# Patient Record
Sex: Male | Born: 2000 | Race: Black or African American | Hispanic: No | Marital: Single | Smoking: Never smoker
Health system: Southern US, Community
[De-identification: ages and names within clinical notes are randomized; demographics above are authoritative.]

---
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
## 2020-10-13 ENCOUNTER — Other Ambulatory Visit: Payer: Self-pay

## 2020-10-13 ENCOUNTER — Ambulatory Visit
Admission: RE | Admit: 2020-10-13 | Discharge: 2020-10-13 | Disposition: A | Discharge status: Home / Self Care | Source: Ambulatory Visit | Attending: Family Medicine | Admitting: Family Medicine

## 2020-10-13 ENCOUNTER — Other Ambulatory Visit: Payer: Self-pay | Admitting: Family Medicine

## 2020-10-13 DIAGNOSIS — R079 Chest pain, unspecified: Secondary | ICD-10-CM

## 2020-12-23 ENCOUNTER — Other Ambulatory Visit: Payer: Self-pay

## 2020-12-23 ENCOUNTER — Ambulatory Visit (INDEPENDENT_AMBULATORY_CARE_PROVIDER_SITE_OTHER): Admitting: Podiatry

## 2020-12-23 ENCOUNTER — Other Ambulatory Visit: Payer: Self-pay | Admitting: Podiatry

## 2020-12-23 DIAGNOSIS — L03031 Cellulitis of right toe: Secondary | ICD-10-CM

## 2020-12-23 DIAGNOSIS — M79674 Pain in right toe(s): Secondary | ICD-10-CM | POA: Diagnosis not present

## 2020-12-23 MED ORDER — CEPHALEXIN 500 MG PO CAPS: 500.0000 mg | ORAL_CAPSULE | Freq: Three times a day (TID) | ORAL | 0 refills | Status: DC

## 2020-12-23 NOTE — Patient Instructions (Signed)

## 2020-12-26 LAB — WOUND CULTURE
MICRO NUMBER:: 11481510
SPECIMEN QUALITY:: ADEQUATE

## 2020-12-26 LAB — HOUSE ACCOUNT TRACKING

## 2020-12-28 NOTE — Progress Notes (Signed)
Subjective: Patient as well as his mom for concerns of ingrown toenail to the right great toe, medial aspect.  This is been ongoing for last 2 weeks.  He has had no recent treatment.  The area is tender with pressure.  He has no other concerns today. Denies any systemic complaints such as fevers, chills, nausea, vomiting. No acute changes since last appointment, and no other complaints at this time.   Objective: AAO x3, NAD DP/PT pulses palpable bilaterally, CRT less than 3 seconds Incurvation present to the medial aspect of the right hallux toenail with localized edema and erythema.  There is no drainage or pus prior to the procedure.  See procedure note below.  No ascending cellulitis.  There is tenderness palpation of the medial nail border.  No other areas of discomfort.  No pain with calf compression, swelling, warmth, erythema  Assessment: Right medial hallux paronychia  Plan: -All treatment options discussed with the patient including all alternatives, risks, complications.  -At this time, the patient is requesting partial nail removal with chemical matricectomy to the symptomatic portion of the nail. Risks and complications were discussed with the patient for which they understand and written consent was obtained. Under sterile conditions a total of 3 mL of a mixture of 2% lidocaine plain and 0.5% Marcaine plain was infiltrated in a hallux block fashion. Once anesthetized, the skin was prepped in sterile fashion. A tourniquet was then applied. Next the medial aspect of hallux nail border was then sharply excised making sure to remove the entire offending nail border.  During this there was purulence identified which was cultured.  Once the nails were ensured to be removed area was debrided and the underlying skin was intact.  No further purulence was identified.  To the purulence it did not apply phenol.  The incision was irrigated with alcohol and Silvadene was applied followed by dry sterile  dressing.  The tourniquet was released and there was found to be immediate capillary fill time of the digit.  Tolerated the procedure well with any complications. -Keflex -Patient encouraged to call the office with any questions, concerns, change in symptoms.   Vivi Barrack DPM

## 2021-01-06 ENCOUNTER — Ambulatory Visit: Payer: Self-pay | Admitting: Podiatry

## 2021-01-19 ENCOUNTER — Ambulatory Visit (INDEPENDENT_AMBULATORY_CARE_PROVIDER_SITE_OTHER): Payer: Self-pay | Admitting: Podiatry

## 2021-01-19 ENCOUNTER — Other Ambulatory Visit: Payer: Self-pay

## 2021-01-19 DIAGNOSIS — L03031 Cellulitis of right toe: Secondary | ICD-10-CM

## 2021-01-19 DIAGNOSIS — M79674 Pain in right toe(s): Secondary | ICD-10-CM

## 2021-01-21 NOTE — Progress Notes (Signed)
Subjective: Jay Edwards is a 20 y.o.  male returns to office today for follow up evaluation after having right Hallux partial nail avulsion performed. Patient has been soaking using epsom satls and applying topical antibiotic covered with bandaid daily.  Denies any change or pus or any significant discomfort at this time.  Patient denies fevers, chills, nausea, vomiting. Denies any calf pain, chest pain, SOB.   Objective:  General: Well developed, nourished, in no acute distress, alert and oriented x3   Dermatology: Skin is warm, dry and supple bilateral. Right hallux nail border appears to be clean, dry, with minimal granular tissue and surrounding scab. There is no surrounding erythema, edema, drainage/purulence. The remaining nails appear unremarkable at this time. There are no other lesions or other signs of infection present.  Neurovascular status: Intact. No lower extremity swelling; No pain with calf compression bilateral.  Musculoskeletal: No tenderness to palpation of the right hallux nail fold. Muscular strength within normal limits bilateral.   Assesement and Plan: S/p partial nail avulsion, doing well.   -Continue soaking in epsom salts twice a day followed by antibiotic ointment and a band-aid. Can leave uncovered at night. Continue this until completely healed.  -If the area has not healed in 2 weeks, call the office for follow-up appointment, or sooner if any problems arise.  -Discussed to monitor any reoccurrence of ingrown toenail.  Should this occur will need to likely have a chemical matricectomy. -Monitor for any signs/symptoms of infection. Call the office immediately if any occur or go directly to the emergency room. Call with any questions/concerns.  Ovid Curd, DPM

## 2021-05-11 ENCOUNTER — Ambulatory Visit (INDEPENDENT_AMBULATORY_CARE_PROVIDER_SITE_OTHER): Admitting: Podiatry

## 2021-05-11 ENCOUNTER — Other Ambulatory Visit: Payer: Self-pay

## 2021-05-11 ENCOUNTER — Encounter: Payer: Self-pay | Admitting: Podiatry

## 2021-05-11 DIAGNOSIS — L600 Ingrowing nail: Secondary | ICD-10-CM

## 2021-05-11 DIAGNOSIS — M79674 Pain in right toe(s): Secondary | ICD-10-CM | POA: Diagnosis not present

## 2021-05-11 NOTE — Patient Instructions (Signed)

## 2021-05-12 ENCOUNTER — Other Ambulatory Visit: Payer: Self-pay | Admitting: Podiatry

## 2021-05-12 ENCOUNTER — Telehealth: Payer: Self-pay | Admitting: Podiatry

## 2021-05-12 MED ORDER — IBUPROFEN 800 MG PO TABS: 800.0000 mg | ORAL_TABLET | Freq: Three times a day (TID) | ORAL | 0 refills | Status: AC | PRN

## 2021-05-12 NOTE — Telephone Encounter (Signed)
Pt's mother called stating her son had his ingrown removed and he is in a lot of pain. He has been taking extra strength tylenol and it isn't working. She would like to know if there's something you can prescribe for the pain. Please advise.

## 2021-05-13 NOTE — Progress Notes (Signed)
Subjective: 20 year old male presents the office today for concerns of recurrent ingrown toenail of the right big toe, medial aspect.  Area is tender and there has been localized swelling redness there is no drainage or pus or red streaking.  No recent injury. Denies any systemic complaints such as fevers, chills, nausea, vomiting. No acute changes since last appointment, and no other complaints at this time.   Objective: AAO x3, NAD DP/PT pulses palpable bilaterally, CRT less than 3 seconds There is tenderness palpation on both the medial and lateral aspects of the right hallux toenail although the medial is the symptomatic painful side the other side is also having swelling or redness.  There is no drainage or pus or ascending cellulitis.  No pain with calf compression, swelling, warmth, erythema  Assessment: Ingrown toenail right hallux  Plan: -All treatment options discussed with the patient including all alternatives, risks, complications.  -At this time, the patient is requesting partial nail removal with chemical matricectomy to the symptomatic portion of the nail. Risks and complications were discussed with the patient for which they understand and written consent was obtained. Under sterile conditions a total of 3 mL of a mixture of 2% lidocaine plain and 0.5% Marcaine plain was infiltrated in a hallux block fashion. Once anesthetized, the skin was prepped in sterile fashion. A tourniquet was then applied. Next the GSSC aspect of hallux nail border was then sharply excised making sure to remove the entire offending nail border. Once the nails were ensured to be removed area was debrided and the underlying skin was intact. There is no purulence identified in the procedure. Next phenol was then applied under standard conditions and copiously irrigated. Silvadene was applied. A dry sterile dressing was applied. After application of the dressing the tourniquet was removed and there is found to be  an immediate capillary refill time to the digit. The patient tolerated the procedure well any complications. Post procedure instructions were discussed the patient for which he verbally understood. Follow-up in one week for nail check or sooner if any problems are to arise. Discussed signs/symptoms of infection and directed to call the office immediately should any occur or go directly to the emergency room. In the meantime, encouraged to call the office with any questions, concerns, changes symptoms. -Patient encouraged to call the office with any questions, concerns, change in symptoms.   Vivi Barrack DPM

## 2021-05-26 ENCOUNTER — Ambulatory Visit (INDEPENDENT_AMBULATORY_CARE_PROVIDER_SITE_OTHER): Admitting: Podiatry

## 2021-05-26 ENCOUNTER — Other Ambulatory Visit: Payer: Self-pay

## 2021-05-26 ENCOUNTER — Encounter: Payer: Self-pay | Admitting: Podiatry

## 2021-05-26 DIAGNOSIS — L600 Ingrowing nail: Secondary | ICD-10-CM

## 2021-05-26 NOTE — Patient Instructions (Signed)

## 2021-05-29 DIAGNOSIS — L600 Ingrowing nail: Secondary | ICD-10-CM | POA: Insufficient documentation

## 2021-05-29 NOTE — Progress Notes (Signed)
Subjective: Jay Edwards is a 20 y.o.  male returns to office today for follow up evaluation after having right Hallux medial and lateral partial avulsion performed with chemical matricectomy. Patient has been soaking using Epsom salts and applying topical antibiotic covered with bandaid daily.  Denies any significant pain at this point.  No swelling or redness or any drainage.  Does get ingrown toenail left big toe and is considering having that side performed as well.  It does cause discomfort with pressure.  Patient denies fevers, chills, nausea, vomiting. Denies any calf pain, chest pain, SOB.   Objective:  General: Well developed, nourished, in no acute distress, alert and oriented x3   Dermatology: Skin is warm, dry and supple bilateral.  Right hallux nail border appears to be clean, dry, with slight granular tissue and surrounding scab. There is no surrounding erythema, edema, drainage/purulence.  Incurvation present to left hallux toenail both medial lateral nail borders with tenderness palpation of there is no edema, erythema or signs of infection.  Neurovascular status: Intact. No lower extremity swelling; No pain with calf compression bilateral.  Musculoskeletal: No significant tenderness to palpation of the right hallux nail fold. Muscular strength within normal limits bilateral.   Assesement and Plan: S/p partial nail avulsion, doing well.   -Continue soaking in epsom salts twice a day followed by antibiotic ointment and a band-aid. Can leave uncovered at night. Continue this until completely healed.  -If the area has not healed in 2 weeks, call the office for follow-up appointment, or sooner if any problems arise.  -We will plan to have the left partial nail avulsion performed in the next couple weeks.  We will schedule him for this. -Monitor for any signs/symptoms of infection. Call the office immediately if any occur or go directly to the emergency room. Call with any  questions/concerns.  Ovid Curd, DPM

## 2021-06-02 ENCOUNTER — Ambulatory Visit: Admitting: Podiatry

## 2021-06-12 ENCOUNTER — Ambulatory Visit: Admitting: Podiatry

## 2021-06-22 ENCOUNTER — Other Ambulatory Visit: Payer: Self-pay

## 2021-06-22 ENCOUNTER — Ambulatory Visit (INDEPENDENT_AMBULATORY_CARE_PROVIDER_SITE_OTHER): Admitting: Podiatry

## 2021-06-22 ENCOUNTER — Encounter: Payer: Self-pay | Admitting: Podiatry

## 2021-06-22 ENCOUNTER — Telehealth: Payer: Self-pay | Admitting: Podiatry

## 2021-06-22 DIAGNOSIS — M79674 Pain in right toe(s): Secondary | ICD-10-CM | POA: Diagnosis not present

## 2021-06-22 DIAGNOSIS — L600 Ingrowing nail: Secondary | ICD-10-CM

## 2021-06-22 MED ORDER — MUPIROCIN 2 % EX OINT
1.0000 | TOPICAL_OINTMENT | Freq: Two times a day (BID) | CUTANEOUS | 2 refills | Status: AC
Start: 2021-06-22 — End: ?

## 2021-06-22 MED ORDER — MUPIROCIN 2 % EX OINT: 1.0000 "application " | TOPICAL_OINTMENT | Freq: Two times a day (BID) | CUTANEOUS | 2 refills | Status: DC

## 2021-06-22 NOTE — Patient Instructions (Signed)
Continue soaking in epsom salts twice a day followed by antibiotic ointment and a band-aid. Can leave uncovered at night. Continue this until completely healed.  Monitor for any signs/symptoms of infection. Call the office immediately if any occur or go directly to the emergency room. Call with any questions/concerns.  Soak Instructions    THE DAY AFTER THE PROCEDURE  Place 1/4 cup of epsom salts in a quart of warm tap water.  Submerge your foot or feet with outer bandage intact for the initial soak; this will allow the bandage to become moist and wet for easy lift off.  Once you remove your bandage, continue to soak in the solution for 20 minutes.  This soak should be done twice a day.  Next, remove your foot or feet from solution, blot dry the affected area and cover.  You may use a band aid large enough to cover the area or use gauze and tape.  Apply other medications to the area as directed by the doctor such as polysporin neosporin.  IF YOUR SKIN BECOMES IRRITATED WHILE USING THESE INSTRUCTIONS, IT IS OKAY TO SWITCH TO  WHITE VINEGAR AND WATER. Or you may use antibacterial soap and water to keep the toe clean  Monitor for any signs/symptoms of infection. Call the office immediately if any occur or go directly to the emergency room. Call with any questions/concerns.

## 2021-06-22 NOTE — Telephone Encounter (Signed)
Pts mom called and they have went to [facility] 2 times and they are still telling pt the have not gotten it electronically. The mom is asking if you could please just call it in to the [facility] in [place] on [address].

## 2021-06-22 NOTE — Telephone Encounter (Signed)
Called and spoke with pharmacist and did a verbal order. Misty Stanley

## 2021-06-24 NOTE — Progress Notes (Signed)
Subjective: 20 year old male presents the office with his mom for concerns of discomfort in his right big toe.  Previously had a partial nail avulsion performed in June and he states the toe is getting better but then just stopped improving.  He has occasional discomfort.  Denies any drainage or pus or any swelling or redness.  Points to lateral nail border he has majority symptoms. Denies any systemic complaints such as fevers, chills, nausea, vomiting. No acute changes since last appointment, and no other complaints at this time.   He is going to [place] next week and will be doing a lot of walking.  Objective: AAO x3, NAD DP/PT pulses palpable bilaterally, CRT less than 3 seconds Of the right hallux toenail at the lateral aspect of the base of the nail small amount granulation tissue is present.  Faint clear drainage there is no purulence.  There is no edema, erythema or signs of infection.  No ascending cellulitis.  No other areas of discomfort. No open lesions or pre-ulcerative lesions.  No pain with calf compression, swelling, warmth, erythema  Assessment: History of partial nail avulsion with continued discomfort right side  Plan: -All treatment options discussed with the patient including all alternatives, risks, complications.  -I discussed repeat cardioversion versus trying conservative treatment.  Recommended oral antibiotics with the mom was to try to do topical care prescribed mupirocin ointment and recommend Epson salt soaks twice a day covering with antibiotic ointment.  If no improvement with oral antibiotic if no improvement will need repeat partial avulsion. -Patient encouraged to call the office with any questions, concerns, change in symptoms.   Vivi Barrack DPM

## 2021-07-14 ENCOUNTER — Other Ambulatory Visit: Payer: Self-pay

## 2021-07-14 ENCOUNTER — Ambulatory Visit (INDEPENDENT_AMBULATORY_CARE_PROVIDER_SITE_OTHER): Admitting: Podiatry

## 2021-07-14 ENCOUNTER — Ambulatory Visit: Admitting: Podiatry

## 2021-07-14 DIAGNOSIS — L600 Ingrowing nail: Secondary | ICD-10-CM

## 2021-07-17 ENCOUNTER — Ambulatory Visit: Admitting: Podiatrist

## 2021-07-20 NOTE — Progress Notes (Signed)
Subjective: 20 year old male presents the office with his mom for follow-up evaluation of ingrown toenail right big toe.  I will also and I prescribed mupirocin ointment.  Follow-up with her primary care physician was started on the oral antibiotics.  States he may be looking somewhat better but still causing discomfort.  No drainage or pus.  Objective: AAO x3, NAD-presents with mom DP/PT pulses palpable bilaterally, CRT less than 3 seconds Of the right hallux toenail at the lateral aspect of the base of the nail small amount granulation tissue is still present.  There is no drainage or pus identified.  Minimal edema.  No erythema.  No drainage or pus.  No ascending cellulitis. The nail itself is mildly dystrophic with some transverse ridging. No open lesions or pre-ulcerative lesions.  No pain with calf compression, swelling, warmth, erythema  Assessment: History of partial nail avulsion with continued discomfort right side  Plan: -All treatment options discussed with the patient including all alternatives, risks, complications.  -Previously did mupirocin ointment with not see much improvement so his primary care physician did start him on oral antibiotics.  Even with oral antibiotics discussed that treating the ingrown toenail at this point antibiotics are not going to be overly beneficial.  Discussed when to remove the ingrown portion of the nail.  They did bring up total nail removal today.  I discussed doing this versus repeat partial nail avulsion but I think that as we do more partial nail avulsion the nails get more than a narrow.  Also the nail started to come somewhat mildly dystrophic discussed total nail removal to see if the new nail growing back and would be healthier but discussed this is not a guarantee.  After discussion elects proceed with nail avulsion but likely was on Friday as if we can recover.  I am not in the office on Friday since no follow Dr. Irving Shows for this.   Vivi Barrack DPM

## 2021-08-21 IMAGING — CR DG CHEST 2V
2 series · 2 of 2 positions shown · non-contrast
Comparison: None.

CLINICAL DATA: Left-sided chest pain for 2 weeks

EXAM:
CHEST - 2 VIEW

[w chest pa]
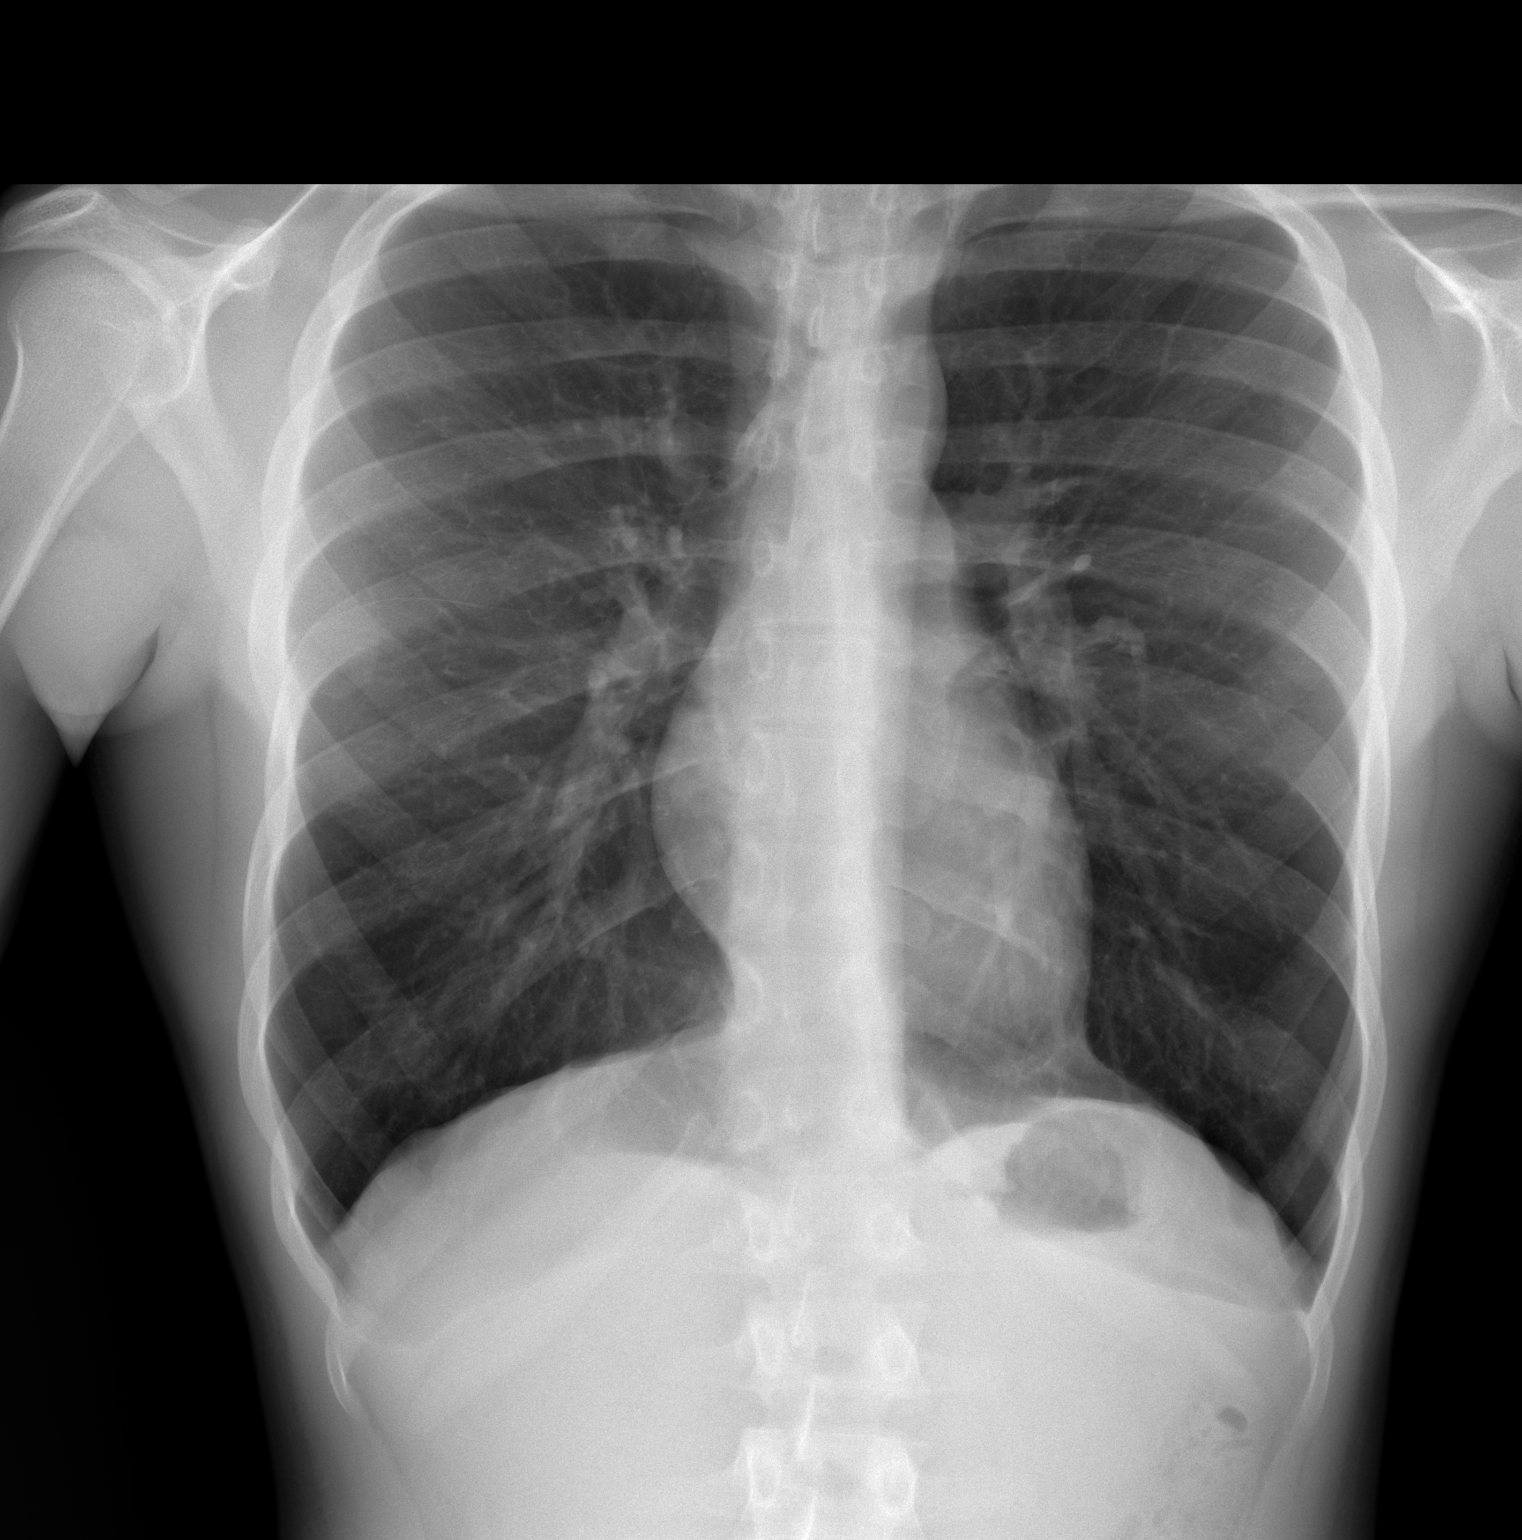

[w chest lat]
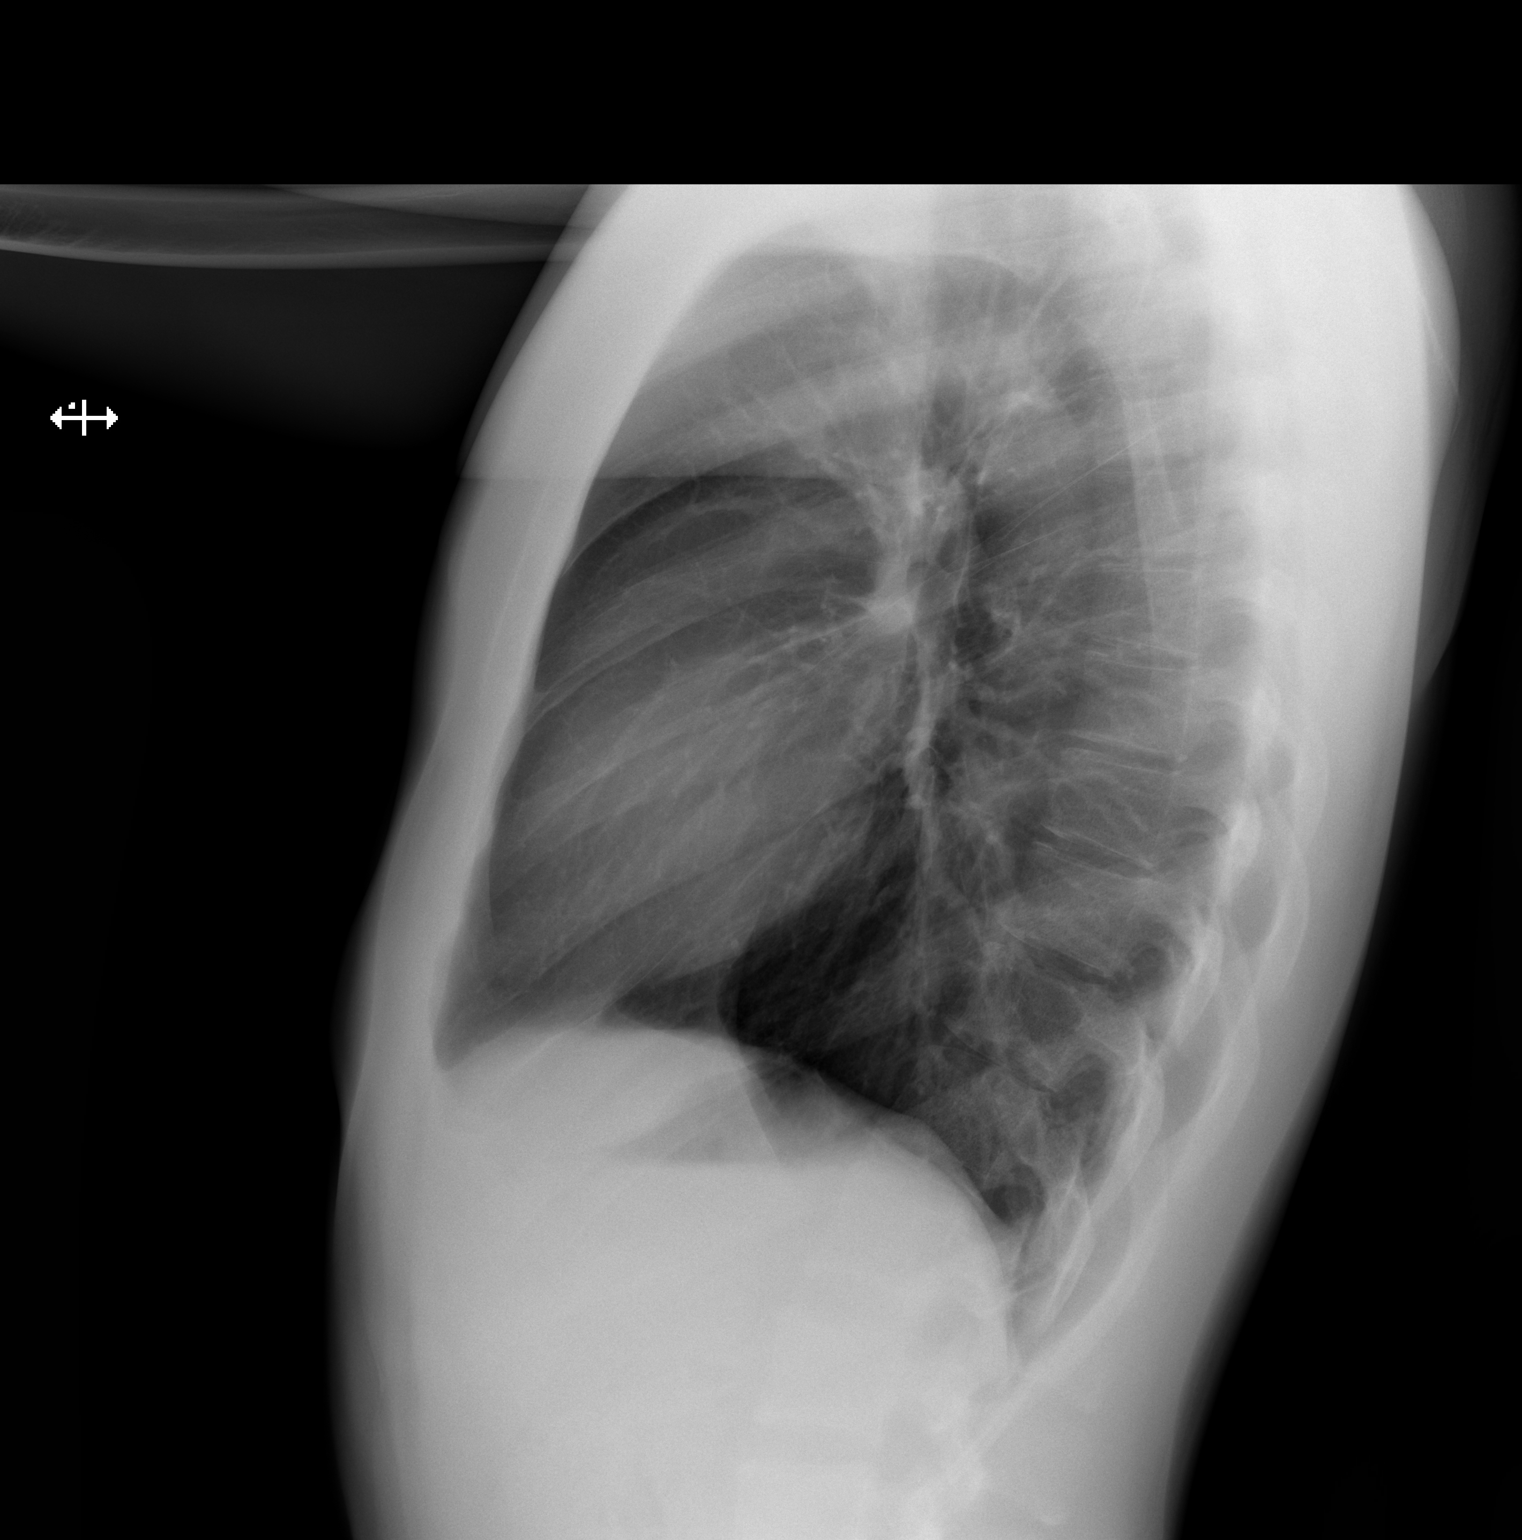

[2 of 2 positions shown; findings below may reference images not displayed]

FINDINGS: The heart size and mediastinal contours are within normal limits.
Both lungs are clear. The visualized skeletal structures are
unremarkable.
IMPRESSION: No active cardiopulmonary disease.

## 2021-09-22 ENCOUNTER — Ambulatory Visit (INDEPENDENT_AMBULATORY_CARE_PROVIDER_SITE_OTHER): Admitting: Podiatry

## 2021-09-22 ENCOUNTER — Other Ambulatory Visit: Payer: Self-pay

## 2021-09-22 DIAGNOSIS — M79674 Pain in right toe(s): Secondary | ICD-10-CM | POA: Diagnosis not present

## 2021-09-22 DIAGNOSIS — L600 Ingrowing nail: Secondary | ICD-10-CM

## 2021-09-22 MED ORDER — MUPIROCIN 2 % EX OINT: 1.0000 "application " | TOPICAL_OINTMENT | Freq: Two times a day (BID) | CUTANEOUS | 2 refills | Status: AC

## 2021-09-22 MED ORDER — CEPHALEXIN 500 MG PO CAPS: 500.0000 mg | ORAL_CAPSULE | Freq: Three times a day (TID) | ORAL | 0 refills | Status: DC

## 2021-09-22 NOTE — Patient Instructions (Signed)

## 2021-09-25 NOTE — Progress Notes (Signed)
Subjective: 20 year old male presents the office today for evaluation of ingrown toenail to his right big toe.  He states he is scheduled to have it removed in a couple weeks but he wants to have the toe checked to make sure it still can be removed.  He has not seen any drainage or pus.  Nail border still tender.  Objective: AAO x3, NAD DP/PT pulses palpable bilaterally, CRT less than 3 seconds Incurvation present along the right hallux, lateral nail border and there is granulation tissue present nail border.  Localized edema and erythema.  No ascending cellulitis.  No purulence noted today.  There is no open lesions.  The nails are mildly dystrophic and discolored.  No pain with calf compression, swelling, warmth, erythema  Assessment: Ingrown toenail right hallux  Plan: -All treatment options discussed with the patient including all alternatives, risks, complications.  -We again today had a long discussion regards to treatment options both partial versus total nail avulsion.  Discussed tenderness today but he wants to hold off and try to wait until before the holidays to be of time to recover without having to walk around campus.  Prescribed cephalexin as well as mupirocin ointment.  Recommended soaking them daily.  If there is any worsening prior to his appointment we will need to proceed with a nail removal before scheduled appointment. -Patient encouraged to call the office with any questions, concerns, change in symptoms.   Vivi Barrack DPM

## 2021-10-09 ENCOUNTER — Other Ambulatory Visit: Payer: Self-pay

## 2021-10-09 ENCOUNTER — Ambulatory Visit (INDEPENDENT_AMBULATORY_CARE_PROVIDER_SITE_OTHER): Admitting: Podiatry

## 2021-10-09 DIAGNOSIS — L600 Ingrowing nail: Secondary | ICD-10-CM

## 2021-10-11 NOTE — Progress Notes (Signed)
Subjective: 20 year old male presents the office today for evaluation of ingrown toenail to his right big toe.  He is scheduled for procedure on Monday and he was at the toenail checked prior to the procedure on Monday.  Reports course of antibiotics.  No purulence.  He has been soaking him and antibiotic ointment on the nail.  No other concerns today.   Objective: AAO x3, NAD DP/PT pulses palpable bilaterally, CRT less than 3 seconds Incurvation present along the right hallux, lateral nail border and there is dried granulation tissue/scab present nail border.  Minimal localized edema and erythema.  No ascending cellulitis.  No purulence noted today.  There is no open lesions.  The nails are mildly dystrophic and discolored.  No pain with calf compression, swelling, warmth, erythema  Assessment: Ingrown toenail right hallux  Plan: -All treatment options discussed with the patient including all alternatives, risks, complications.  -We again had discussions in regards to treatment options.  We discussed partial versus total nail avulsions.  I think this point we can go ahead and proceed with the procedure but he does not want to do it today we will plan on doing this on Monday.  Over the weekend continue soaking Epson salts, antibiotic ointment and a bandage. -Monitor for any clinical signs or symptoms of infection and directed to call the office immediately should any occur or go to the ER.  Vivi Barrack DPM

## 2021-10-12 ENCOUNTER — Ambulatory Visit: Admitting: Podiatry

## 2021-10-12 ENCOUNTER — Ambulatory Visit (INDEPENDENT_AMBULATORY_CARE_PROVIDER_SITE_OTHER): Admitting: Podiatry

## 2021-10-12 ENCOUNTER — Other Ambulatory Visit: Payer: Self-pay

## 2021-10-12 DIAGNOSIS — M79674 Pain in right toe(s): Secondary | ICD-10-CM

## 2021-10-12 DIAGNOSIS — L600 Ingrowing nail: Secondary | ICD-10-CM

## 2021-10-12 NOTE — Patient Instructions (Signed)

## 2021-10-18 NOTE — Progress Notes (Signed)
Subjective: 20 year old male presents the office today with his mom for follow-up evaluation of ingrown toenail of the right big toe.  The patient mom and also his father who was on speaker phone today one half entire toenail removed.  We discussed this on several occasions previously.  He still been soaking in Epson salts, antibiotic ointment and a bandage.  The lateral border of the nail still becomes ingrown causing discomfort.    Objective: AAO x3, NAD DP/PT pulses palpable bilaterally, CRT less than 3 seconds Incurvation presents on the right hallux lateral nail border with mild granulation tissue present.  There is no drainage or pus.  Faint edema likely more from inflammation as opposed to infection.  Tenderness palpation lateral nail border.  No open lesions. No pain with calf compression, swelling, warmth, erythema  Assessment: Ingrown toenail recurrence  Plan: -All treatment options discussed with the patient including all alternatives, risks, complications.  -At this time, the patient and his family are requesting total nail removal without chemical matricectomy to the right hallux given the reoccurrence of the ingrown toenail.  Discussed risks of removing entire toenail.  They understand.  Risks and complications were discussed with the patient for which they understand and  verbally consent to the procedure. Under sterile conditions a total of 3 mL of a mixture of 2% lidocaine plain and 0.5% Marcaine plain was infiltrated in a hallux block fashion. Once anesthetized, the skin was prepped in sterile fashion. A tourniquet was then applied. Next the right hallux nail was was sharply excised making sure to remove the entire offending nail nail. Once the nail was removed, the area was debrided and the underlying skin was intact. The area was irrigated and hemostasis was obtained.  A dry sterile dressing was applied. After application of the dressing the tourniquet was removed and there is found  to be an immediate capillary refill time to the digit. The patient tolerated the procedure well any complications. Post procedure instructions were discussed the patient for which he verbally understood. Follow-up in one week for nail check or sooner if any problems are to arise. Discussed signs/symptoms of worsening infection and directed to call the office immediately should any occur or go directly to the emergency room. In the meantime, encouraged to call the office with any questions, concerns, changes symptoms. -Patient encouraged to call the office with any questions, concerns, change in symptoms.   Vivi Barrack DPM

## 2021-10-26 ENCOUNTER — Ambulatory Visit: Admitting: Podiatry

## 2021-11-03 ENCOUNTER — Ambulatory Visit (INDEPENDENT_AMBULATORY_CARE_PROVIDER_SITE_OTHER): Admitting: Podiatry

## 2021-11-03 ENCOUNTER — Other Ambulatory Visit: Payer: Self-pay

## 2021-11-03 DIAGNOSIS — L600 Ingrowing nail: Secondary | ICD-10-CM

## 2021-11-03 NOTE — Patient Instructions (Signed)
Continue soaking in epsom salts daily followed by antibiotic ointment and a band-aid. Can leave uncovered at night. Continue this until completely healed.  If the area has not healed in 2 weeks, call the office for follow-up appointment, or sooner if any problems arise.  Monitor for any signs/symptoms of infection. Call the office immediately if any occur or go directly to the emergency room. Call with any questions/concerns.  

## 2021-11-06 NOTE — Progress Notes (Signed)
Subjective: 20 year old male presents the office today for follow-up evaluation after undergoing right total nail avulsion.  He states he has been doing well.  Still been soaking in Epson salts.  Not seeing any drainage or pus.  No increased swelling or any redness.  No significant pain.  No other concerns.  Objective: AAO x3, NAD DP/PT pulses palpable bilaterally, CRT less than 3 seconds Status post total nail avulsion right hallux with small mount of scabbing is present.  No significant granulation tissue is present.  There is no edema, erythema, drainage or pus or any signs of infection.  No open lesions identified. No pain with calf compression, swelling, warmth, erythema  Assessment: Status post toenail avulsion  Plan: -All treatment options discussed with the patient including all alternatives, risks, complications.  -Appears to be healing well at this point postoperatively.  I would still soak in Epson salts at least once a day.  Can wash with soap and water daily.  Apply a small amount of antibiotic ointment during the day with a bandage but can leave the area open at nighttime.  Discussed need to monitor the nails it comes back down.  Should cause any issues to let me know.  Otherwise I will see him back as needed. -Patient encouraged to call the office with any questions, concerns, change in symptoms.   Vivi Barrack DPM

## 2022-02-19 ENCOUNTER — Ambulatory Visit: Payer: 59 | Admitting: Family Medicine

## 2022-02-19 VITALS — BP 131/70 | Temp 97.1°F | Resp 12 | Ht 73.0 in | Wt 182.0 lb

## 2022-02-19 DIAGNOSIS — J039 Acute tonsillitis, unspecified: Secondary | ICD-10-CM | POA: Diagnosis not present

## 2022-02-19 MED ORDER — AMOXICILLIN 875 MG PO TABS: 875.0000 mg | ORAL_TABLET | Freq: Two times a day (BID) | ORAL | 0 refills | Status: AC

## 2022-02-19 MED ORDER — PSEUDOEPH-BROMPHEN-DM 30-2-10 MG/5ML PO SYRP: 5.0000 mL | ORAL_SOLUTION | Freq: Four times a day (QID) | ORAL | 0 refills | Status: AC | PRN

## 2022-02-19 NOTE — Progress Notes (Signed)
Jay Edwards is a 21 y.o. male presenting with a sore throat for 5 days. ? ?Associated symptoms include:  none.  Symptoms are constant. ? ?Home treatment thus far includes:  rest and hydration. ? ?No known sick contacts with similar symptoms. ? ?There is no history of of similar symptoms. ? ?Reviewed allergies, PMH, PSH, sochhx. See chart. ? ?Exam:  BP 131/70   Temp (!) 97.1 ?F (36.2 ?C) (Temporal)   Resp 12   Ht 6\' 1"  (1.854 m)   Wt 182 lb (82.6 kg)   SpO2 98%   BMI 24.01 kg/m?  ? ?Physical Exam ?Vitals and nursing note reviewed.  ?Constitutional:   ?   Appearance: He is well-developed. He is not ill-appearing.  ?HENT:  ?   Head: Normocephalic and atraumatic.  ?   Right Ear: Tympanic membrane and ear canal normal.  ?   Left Ear: Tympanic membrane and ear canal normal.  ?   Mouth/Throat:  ?   Mouth: Mucous membranes are moist. No oral lesions.  ?   Pharynx: Posterior oropharyngeal erythema present.  ?   Tonsils: Tonsillar exudate (mild) present. No tonsillar abscesses.  ?Eyes:  ?   Conjunctiva/sclera: Conjunctivae normal.  ?   Pupils: Pupils are equal, round, and reactive to light.  ?Neck:  ?   Thyroid: No thyromegaly.  ?Cardiovascular:  ?   Rate and Rhythm: Normal rate and regular rhythm.  ?   Heart sounds: Normal heart sounds.  ?Pulmonary:  ?   Effort: Pulmonary effort is normal. No respiratory distress.  ?   Breath sounds: Normal breath sounds.  ?Musculoskeletal:  ?   Cervical back: Normal range of motion and neck supple.  ?Lymphadenopathy:  ?   Cervical: No cervical adenopathy.  ?Skin: ?   Capillary Refill: Capillary refill takes less than 2 seconds.  ?Neurological:  ?   General: No focal deficit present.  ?   Mental Status: He is alert and oriented to person, place, and time.  ?Psychiatric:     ?   Mood and Affect: Mood normal.     ?   Behavior: Behavior normal.  ?  ? ? ?Jay Edwards was seen today for sore throat. ? ?Diagnoses and all orders for this visit: ? ?Tonsillitis ?-     amoxicillin (AMOXIL) 875 MG  tablet; Take 1 tablet (875 mg total) by mouth 2 (two) times daily for 10 days. ?-     brompheniramine-pseudoephedrine-DM 30-2-10 MG/5ML syrup; Take 5 mLs by mouth 4 (four) times daily as needed. ? ? ?RTC prn ?

## 2022-02-23 ENCOUNTER — Encounter: Payer: Self-pay | Admitting: Family Medicine

## 2022-05-13 ENCOUNTER — Ambulatory Visit (INDEPENDENT_AMBULATORY_CARE_PROVIDER_SITE_OTHER): Admitting: Podiatry

## 2022-05-13 DIAGNOSIS — L600 Ingrowing nail: Secondary | ICD-10-CM | POA: Diagnosis not present

## 2022-05-19 NOTE — Progress Notes (Signed)
  Subjective:  Patient ID: Jay Edwards, male    DOB: [birth year],  MRN: [id number]  Chief Complaint  Patient presents with   Ingrown Toenail    Ingrown right great toe.    21 y.o. male presents with the above complaint.  Patient presents with continuous recurring right hallux medial border ingrown.  Patient states he must have had at least multiple procedures done more than 2 on the medial side of the toe.  Hurts with ambulation has progressive gotten worse seems to be going back again.  He is known to Dr. Ardelle Anton..  She denies any other acute complaints he would like to discuss more aggressive treatment options if any.   Review of Systems: Negative except as noted in the HPI. Denies N/V/F/Ch.  No past medical history on file.  Current Outpatient Medications:    Ascorbic Acid (VITAMIN C PO), Take 1 tablet by mouth daily., Disp: , Rfl:    brompheniramine-pseudoephedrine-DM 30-2-10 MG/5ML syrup, Take 5 mLs by mouth 4 (four) times daily as needed., Disp: 120 mL, Rfl: 0   Cholecalciferol (VITAMIN D PO), Take 1 tablet by mouth daily., Disp: , Rfl:    diphenhydrAMINE (BENADRYL) 12.5 MG/5ML liquid, Take 25 mg by mouth 4 (four) times daily as needed for itching or allergies., Disp: , Rfl:    Flaxseed, Linseed, (FLAXSEED OIL PO), Take 1 capsule by mouth daily., Disp: , Rfl:    ibuprofen (ADVIL) 800 MG tablet, Take 1 tablet (800 mg total) by mouth every 8 (eight) hours as needed., Disp: 30 tablet, Rfl: 0   IRON PO, Take 1 tablet by mouth daily., Disp: , Rfl:    mupirocin ointment (BACTROBAN) 2 %, Apply 1 application topically 2 (two) times daily., Disp: 30 g, Rfl: 2   mupirocin ointment (BACTROBAN) 2 %, Apply 1 application topically 2 (two) times daily., Disp: 30 g, Rfl: 2   Permethrin LIQD, Apply to entire body from neck down and wash off 12 hours later., Disp: 1 Bottle, Rfl: 0  Social History   Tobacco Use  Smoking Status Never  Smokeless Tobacco Never    No Known Allergies Objective:   There were no vitals filed for this visit. There is no height or weight on file to calculate BMI. Constitutional Well developed. Well nourished.  Vascular Dorsalis pedis pulses palpable bilaterally. Posterior tibial pulses palpable bilaterally. Capillary refill normal to all digits.  No cyanosis or clubbing noted. Pedal hair growth normal.  Neurologic Normal speech. Oriented to person, place, and time. Epicritic sensation to light touch grossly present bilaterally.  Dermatologic Painful ingrowing nail at medial nail borders of the hallux nail right. No other open wounds. No skin lesions.  Orthopedic: Normal joint ROM without pain or crepitus bilaterally. No visible deformities. No bony tenderness.   Radiographs: None Assessment:   1. Ingrown right big toenail    Plan:  Patient was evaluated and treated and all questions answered.  Ingrown Nail, right~multiple recurring -All questions and concerns were discussed with the patient in extensive detail.  Ultimately I discussed with the patient that he will benefit from [facility] procedure/surgical matricectomy to removing the ingrown.  Given that he has failed multiple phenol matricectomy procedures he will benefit from surgical excision of the matricectomy.  I discussed my procedure briefly.  He would like to think about it and get back to me.  No follow-ups on file.

## 2022-10-11 ENCOUNTER — Telehealth: Payer: Self-pay | Admitting: Podiatry

## 2022-10-11 NOTE — Telephone Encounter (Signed)
DOS: 11/08/2022  [employer] Effective 11/22/2021  Hallux Rt Ingrown Exc. of Skin Nail Fold (11765)  DX: L60.0  Deductible: $3,000 with $1,510.10 remaining Out-of-Pocket: $6,850 with $5,360.10 remaining CoInsurance: 20%  Prior authorization is not required per [employer].  Decision ID #:[id number]

## 2022-11-01 ENCOUNTER — Telehealth: Payer: Self-pay

## 2022-11-01 NOTE — Telephone Encounter (Signed)
Jay Edwards called to cancel his surgery with Dr Allena Katz on 11/08/2022. He stated he will be traveling and his toe is better. Notified Dr. Allena Katz and Aram Beecham with [facility].

## 2022-11-02 ENCOUNTER — Ambulatory Visit (INDEPENDENT_AMBULATORY_CARE_PROVIDER_SITE_OTHER): Payer: 59 | Admitting: Family Medicine

## 2022-11-02 ENCOUNTER — Encounter: Payer: Self-pay | Admitting: Family Medicine

## 2022-11-02 VITALS — BP 131/78 | HR 64 | Temp 97.3°F | Ht 73.0 in | Wt 179.0 lb

## 2022-11-02 DIAGNOSIS — K641 Second degree hemorrhoids: Secondary | ICD-10-CM

## 2022-11-02 MED ORDER — HYDROCORTISONE ACETATE 25 MG RE SUPP: 25.0000 mg | Freq: Two times a day (BID) | RECTAL | 0 refills | Status: AC

## 2022-11-02 MED ORDER — POLYETHYLENE GLYCOL 3350 17 GM/SCOOP PO POWD: 17.0000 g | Freq: Every day | ORAL | 0 refills | Status: AC | PRN

## 2022-11-02 NOTE — Progress Notes (Signed)
Established Patient Office Visit  Subjective   Patient ID: Jay Edwards, male    DOB: 2001/09/05  Age: 21 y.o. MRN: [id number]  Chief Complaint  Patient presents with   Follow-up    Pt. Is here for f/u on hemorrhoids.     "I have hemorrhoids."  21 year old male patient presents with complaints of hemorrhoids.  Patient states he has had hemorrhoids for less than 2 years.  He has not had a flareup.  Presenting medication regimen for the control.  Denies any issues with constipation states he has a bowel movement every day.  Equivocal straining.  Stated clearly hemorrhoids sometimes changes.  Denies any bleeding.  No melena episodes.  No swollen or tender masses.  Patient has not had a rectal exam before.  No known family history of rectal cancer or colon cancer.  Patient denies any weight loss, worsening tenesmus, anal pain.  Patient states he has some discomfort  when he has to push stool around the hemorrhoids.      Review of Systems  All other systems reviewed and are negative.     Objective:     BP 131/78   Pulse 64   Temp (!) 97.3 F (36.3 C)   Ht 6\' 1"  (1.854 m)   Wt 179 lb (81.2 kg)   SpO2 98%   BMI 23.62 kg/m    Physical Exam Vitals and nursing note reviewed.  Constitutional:      General: He is not in acute distress.    Appearance: Normal appearance. He is normal weight. He is not ill-appearing.  HENT:     Head: Normocephalic and atraumatic.  Eyes:     Extraocular Movements: Extraocular movements intact.     Pupils: Pupils are equal, round, and reactive to light.  Cardiovascular:     Rate and Rhythm: Normal rate and regular rhythm.     Heart sounds: Normal heart sounds.  Pulmonary:     Effort: Pulmonary effort is normal.     Breath sounds: Normal breath sounds.  Abdominal:     General: Abdomen is flat. Bowel sounds are normal. There is no distension.     Palpations: Abdomen is soft. There is no mass.     Tenderness: There is no abdominal tenderness.  There is no guarding or rebound.     Hernia: No hernia is present.  Genitourinary:    Prostate: Normal.     Rectum: External hemorrhoid (3 oclock 2 deg) and internal hemorrhoid (6 oclock) present. No mass, tenderness or anal fissure. Normal anal tone.  Skin:    General: Skin is warm.     Capillary Refill: Capillary refill takes less than 2 seconds.     Coloration: Skin is not pale.     Findings: No rash.  Neurological:     General: No focal deficit present.     Mental Status: He is alert and oriented to person, place, and time. Mental status is at baseline.  Psychiatric:        Mood and Affect: Mood normal.        Behavior: Behavior normal.      No results found for any visits on 11/02/22.    The ASCVD Risk score (Arnett DK, et al., 2019) failed to calculate for the following reasons:   The 2019 ASCVD risk score is only valid for ages 41 to 20    Assessment & Plan:   Problem List Items Addressed This Visit   None Visit Diagnoses  Grade II hemorrhoids    -  Primary   Relevant Medications   polyethylene glycol powder (GLYCOLAX/MIRALAX) 17 GM/SCOOP powder   hydrocortisone (ANUSOL-HC) 25 MG suppository      Hemorrhoids grade 2, new Discussed preventative measures patient could take discussed what to do with [place] Questions answered, patient voiced understanding, medications as above  Return if symptoms worsen or fail to improve.    Elwin Mocha, MD

## 2022-11-08 ENCOUNTER — Telehealth: Payer: Self-pay | Admitting: Family Medicine

## 2022-11-08 DIAGNOSIS — R197 Diarrhea, unspecified: Secondary | ICD-10-CM

## 2022-11-08 NOTE — Telephone Encounter (Signed)
>  7d diarrhea NBNB Recent change in diet No sick contacts No melena No laxatives  Jay Salter, MD

## 2022-11-09 ENCOUNTER — Ambulatory Visit (HOSPITAL_BASED_OUTPATIENT_CLINIC_OR_DEPARTMENT_OTHER)
Admission: RE | Admit: 2022-11-09 | Discharge: 2022-11-09 | Disposition: A | Discharge status: Home / Self Care | Payer: 59 | Source: Ambulatory Visit | Attending: Family Medicine | Admitting: Family Medicine

## 2022-11-09 DIAGNOSIS — R197 Diarrhea, unspecified: Secondary | ICD-10-CM | POA: Insufficient documentation

## 2022-11-24 ENCOUNTER — Ambulatory Visit: Payer: 59 | Admitting: Podiatry

## 2024-06-08 ENCOUNTER — Emergency Department (HOSPITAL_COMMUNITY)
Admission: EM | Admit: 2024-06-08 | Discharge: 2024-06-09 | Disposition: A | Discharge status: Home / Self Care | Attending: Emergency Medicine | Admitting: Emergency Medicine

## 2024-06-08 ENCOUNTER — Other Ambulatory Visit: Payer: Self-pay

## 2024-06-08 ENCOUNTER — Encounter (HOSPITAL_COMMUNITY): Payer: Self-pay | Admitting: *Deleted

## 2024-06-08 DIAGNOSIS — N50819 Testicular pain, unspecified: Secondary | ICD-10-CM | POA: Diagnosis present

## 2024-06-08 DIAGNOSIS — N451 Epididymitis: Secondary | ICD-10-CM | POA: Insufficient documentation

## 2024-06-08 LAB — CBC
HCT: 44.6 % (ref 39.0–52.0)
Hemoglobin: 14.6 g/dL (ref 13.0–17.0)
MCH: 29.1 pg (ref 26.0–34.0)
MCHC: 32.7 g/dL (ref 30.0–36.0)
MCV: 88.8 fL (ref 80.0–100.0)
Platelets: 210 K/uL (ref 150–400)
RBC: 5.02 MIL/uL (ref 4.22–5.81)
RDW: 12.5 % (ref 11.5–15.5)
WBC: 5.3 K/uL (ref 4.0–10.5)
nRBC: 0 % (ref 0.0–0.2)

## 2024-06-08 LAB — URINALYSIS, ROUTINE W REFLEX MICROSCOPIC
Bilirubin Urine: NEGATIVE
Glucose, UA: NEGATIVE mg/dL
Hgb urine dipstick: NEGATIVE
Ketones, ur: NEGATIVE mg/dL
Leukocytes,Ua: NEGATIVE
Nitrite: NEGATIVE
Protein, ur: NEGATIVE mg/dL
Specific Gravity, Urine: 1.024 (ref 1.005–1.030)
pH: 6 (ref 5.0–8.0)

## 2024-06-08 LAB — COMPREHENSIVE METABOLIC PANEL WITH GFR
ALT: 12 U/L (ref 0–44)
AST: 22 U/L (ref 15–41)
Albumin: 4.3 g/dL (ref 3.5–5.0)
Alkaline Phosphatase: 62 U/L (ref 38–126)
Anion gap: 6 (ref 5–15)
BUN: 13 mg/dL (ref 6–20)
CO2: 27 mmol/L (ref 22–32)
Calcium: 9.5 mg/dL (ref 8.9–10.3)
Chloride: 108 mmol/L (ref 98–111)
Creatinine, Ser: 1.03 mg/dL (ref 0.61–1.24)
GFR, Estimated: >60 mL/min (ref >60)
Glucose, Bld: 78 mg/dL (ref 70–99)
Potassium: 3.9 mmol/L (ref 3.5–5.1)
Sodium: 141 mmol/L (ref 135–145)
Total Bilirubin: 1 mg/dL (ref 0.0–1.2)
Total Protein: 7.2 g/dL (ref 6.5–8.1)

## 2024-06-08 NOTE — ED Triage Notes (Signed)
 The pt has had testicle pain and swelling for 3 weeks and the pt is concerned that it is still swollen     temp 98.3

## 2024-06-09 ENCOUNTER — Emergency Department (HOSPITAL_COMMUNITY)

## 2024-06-09 MED ORDER — AMOXICILLIN-POT CLAVULANATE 875-125 MG PO TABS: 1.0000 | ORAL_TABLET | Freq: Two times a day (BID) | ORAL | 0 refills | Status: AC

## 2024-06-09 MED ORDER — KETOROLAC TROMETHAMINE 15 MG/ML IJ SOLN
15.0000 mg | Freq: Once | INTRAMUSCULAR | Status: AC
  Administered 2024-06-09: 15 mg via INTRAMUSCULAR
  Filled 2024-06-09: qty 1

## 2024-06-09 NOTE — Discharge Instructions (Addendum)
 Today you were seen for testicular pain, you were found to have bilateral epididymitis.  Please finish your Levaquin and alternate Tylenol and Motrin  as needed for pain.  If you are still having refinished McClimon, please pick up Augmentin  and take as prescribed.  Please follow-up with your PCP in the upcoming weeks for further evaluation workup.  Thank you for letting us  treat you today. After reviewing your labs and imaging, I feel you are safe to go home. Please follow up with your PCP in the next several days and provide them with your records from this visit. Return to the Emergency Room if pain becomes severe or symptoms worsen.

## 2024-06-09 NOTE — ED Notes (Signed)
 Patient transported to Ultrasound

## 2024-06-09 NOTE — ED Notes (Signed)
This RN reviewed discharge instructions with patient. he verbalized understanding and denied any further questions. PT well appearing upon discharge and reports tolerable pain. Pt ambulated with stable gait to exit. Pt endorses ride home.

## 2024-06-09 NOTE — ED Provider Notes (Signed)
 [place] EMERGENCY DEPARTMENT AT [facility] Provider Note   CSN: [id number] Arrival date & time: 06/08/24  2137     Patient presents with: Testicle Pain   Jay Edwards is a 23 y.o. male presents today for testicular swelling x 3 weeks intermittently.  Patient denies fever, chill, nausea, vomiting, penile discharge, dysuria, hematuria, any other complaints at this time.    Testicle Pain       Prior to Admission medications   Medication Sig Start Date End Date Taking? Authorizing Provider  amoxicillin -clavulanate (AUGMENTIN ) 875-125 MG tablet Take 1 tablet by mouth every 12 (twelve) hours. 06/09/24  Yes Joua Bake N, PA-C  Ascorbic Acid (VITAMIN C PO) Take 1 tablet by mouth daily.    [provider]  brompheniramine-pseudoephedrine-DM 30-2-10 MG/5ML syrup Take 5 mLs by mouth 4 (four) times daily as needed. 02/19/22   Alen Manus HERO, MD  Cholecalciferol (VITAMIN D PO) Take 1 tablet by mouth daily.    [provider]  diphenhydrAMINE (BENADRYL) 12.5 MG/5ML liquid Take 25 mg by mouth 4 (four) times daily as needed for itching or allergies.    [provider]  Flaxseed, Linseed, (FLAXSEED OIL PO) Take 1 capsule by mouth daily.    [provider]  hydrocortisone  (ANUSOL -HC) 25 MG suppository Place 1 suppository (25 mg total) rectally 2 (two) times daily. 11/02/22   Alen Manus HERO, MD  ibuprofen  (ADVIL ) 800 MG tablet Take 1 tablet (800 mg total) by mouth every 8 (eight) hours as needed. 05/12/21   Gershon Donnice SAUNDERS, DPM  IRON PO Take 1 tablet by mouth daily.    [provider]  mupirocin  ointment (BACTROBAN ) 2 % Apply 1 application topically 2 (two) times daily. 06/22/21   Gershon Donnice SAUNDERS, DPM  mupirocin  ointment (BACTROBAN ) 2 % Apply 1 application topically 2 (two) times daily. 09/22/21   Gershon Donnice SAUNDERS, DPM  Permethrin  LIQD Apply to entire body from neck down and wash off 12 hours later. 08/29/18   Berneta Elsie Sayre,  MD    Allergies: Patient has no known allergies.    Review of Systems  Genitourinary:  Positive for testicular pain.    Updated Vital Signs BP 113/82 (BP Location: Left Arm)   Pulse 61   Temp 97.6 F (36.4 C) (Oral)   Resp 17   Ht 6' 1 (1.854 m)   Wt 81.2 kg   SpO2 100%   BMI 23.62 kg/m   Physical Exam Vitals and nursing note reviewed. Exam conducted with a chaperone present.  Constitutional:      General: He is not in acute distress.    Appearance: He is well-developed.  HENT:     Head: Normocephalic and atraumatic.     Right Ear: External ear normal.     Left Ear: External ear normal.  Eyes:     Conjunctiva/sclera: Conjunctivae normal.  Cardiovascular:     Rate and Rhythm: Normal rate and regular rhythm.     Pulses: Normal pulses.     Heart sounds: Normal heart sounds. No murmur heard. Pulmonary:     Effort: Pulmonary effort is normal. No respiratory distress.  Abdominal:     Palpations: Abdomen is soft.     Tenderness: There is no abdominal tenderness.     Hernia: There is no hernia in the left inguinal area or right inguinal area.  Genitourinary:    Penis: Normal and circumcised. No discharge or lesions.      Testes: Normal.  Epididymis:     Right: Normal. No mass.     Left: Normal. No mass.  Musculoskeletal:        General: No swelling.     Cervical back: Neck supple.  Skin:    General: Skin is warm and dry.     Capillary Refill: Capillary refill takes less than 2 seconds.  Neurological:     General: No focal deficit present.     Mental Status: He is alert and oriented to person, place, and time.  Psychiatric:        Mood and Affect: Mood normal.     (all labs ordered are listed, but only abnormal results are displayed) Labs Reviewed  COMPREHENSIVE METABOLIC PANEL WITH GFR  CBC  URINALYSIS, ROUTINE W REFLEX MICROSCOPIC    EKG: None  Radiology: US  SCROTUM W/DOPPLER Result Date: 06/09/2024 CLINICAL DATA:  Scrotal pain and swelling for 3  weeks. EXAM: SCROTAL ULTRASOUND DOPPLER ULTRASOUND OF THE TESTICLES TECHNIQUE: Complete ultrasound examination of the testicles, epididymis, and other scrotal structures was performed. Color and spectral Doppler ultrasound were also utilized to evaluate blood flow to the testicles. COMPARISON:  None Available. FINDINGS: Right testicle Measurements: 4.8 cm x 2.2 cm x 2.7 cm. No mass or microlithiasis visualized. Left testicle Measurements: 4.8 cm x 2.1 cm x 2.8 cm. No mass or microlithiasis visualized. Right epididymis: A 3.4 mm x 2.7 mm x 3.5 mm cyst is seen within the head of the right epididymis. The right epididymis is hypervascular on color Doppler evaluation. Left epididymis: An 8.5 mm x 9.2 mm x 8.9 mm cyst is seen within the head of the left epididymis. The left epididymis is hypervascular on color Doppler evaluation. Hydrocele:  None visualized. Varicocele:  None visualized. Pulsed Doppler interrogation of both testes demonstrates normal low resistance arterial and venous waveforms bilaterally. IMPRESSION: Findings consistent with bilateral epididymitis. Electronically Signed   By: Suzen Dials M.D.   On: 06/09/2024 02:55     Procedures   Medications Ordered in the ED  ketorolac  (TORADOL ) 15 MG/ML injection 15 mg (15 mg Intramuscular Given 06/09/24 0346)                                    Medical Decision Making Amount and/or Complexity of Data Reviewed Radiology: ordered.  Risk Prescription drug management.   This patient presents to the ED for concern of testicular pain differential diagnosis includes torsion, epididymitis, orchitis, trauma    Additional history obtained   Additional history obtained from Electronic Medical Record External records from outside source obtained and reviewed including family medicine notes   Lab Tests:  I Ordered, and personally interpreted labs.  The pertinent results include: CBC, CMP, and UA WNL   Imaging Studies ordered:  I  ordered imaging studies including ultrasound torsion rule out I independently visualized and interpreted imaging which showed Findings consistent with bilateral epididymitis I agree with the radiologist interpretation   Medicines ordered and prescription drug management:  I ordered medication including Toradol     I have reviewed the patients home medicines and have made adjustments as needed   Problem List / ED Course:  Considered for admission or further workup however patient's vital signs, physical exam, labs, and imaging are reassuring.  Patient advised to take Tylenol Motrin  as needed for pain and inflammation, given outpatient course of Augmentin .  Patient to follow-up with urology if symptoms persist for further evaluation workup.  Patient given return precautions.  I feel patient is safe for discharge at this time.        Final diagnoses:  Epididymitis    ED Discharge Orders          Ordered    amoxicillin -clavulanate (AUGMENTIN ) 875-125 MG tablet  Every 12 hours        06/09/24 0339               Francis Ileana SAILOR, PA-C 06/09/24 [id number]    Jerral Meth, MD 06/10/24 0003

## 2024-07-06 ENCOUNTER — Other Ambulatory Visit: Payer: Self-pay | Admitting: Internal Medicine

## 2024-07-06 DIAGNOSIS — N5082 Scrotal pain: Secondary | ICD-10-CM

## 2024-07-10 ENCOUNTER — Inpatient Hospital Stay
Admission: RE | Admit: 2024-07-10 | Discharge: 2024-07-10 | Discharge status: Home / Self Care | Source: Ambulatory Visit | Attending: Internal Medicine | Admitting: Internal Medicine

## 2024-07-10 DIAGNOSIS — N5082 Scrotal pain: Secondary | ICD-10-CM
